# Patient Record
Sex: Female | Born: 1965 | Race: White | Hispanic: No | Marital: Married | Smoking: Never smoker
Health system: Southern US, Community
[De-identification: ages and names within clinical notes are randomized; demographics above are authoritative.]

## PROBLEM LIST (undated history)

## (undated) DIAGNOSIS — G4730 Sleep apnea, unspecified: Secondary | ICD-10-CM

## (undated) DIAGNOSIS — I10 Essential (primary) hypertension: Secondary | ICD-10-CM

## (undated) HISTORY — PX: NO PAST SURGERIES: SHX2092

---

## 2001-04-08 ENCOUNTER — Ambulatory Visit (HOSPITAL_COMMUNITY): Admission: RE | Admit: 2001-04-08 | Discharge: 2001-04-08 | Payer: Self-pay | Admitting: Family Medicine

## 2001-04-08 ENCOUNTER — Encounter: Payer: Self-pay | Admitting: Family Medicine

## 2003-12-30 ENCOUNTER — Other Ambulatory Visit: Admission: RE | Admit: 2003-12-30 | Discharge: 2003-12-30 | Payer: Self-pay | Admitting: Obstetrics and Gynecology

## 2004-03-03 ENCOUNTER — Ambulatory Visit (HOSPITAL_COMMUNITY): Admission: RE | Admit: 2004-03-03 | Discharge: 2004-03-03 | Payer: Self-pay | Admitting: Family Medicine

## 2005-05-01 ENCOUNTER — Other Ambulatory Visit: Admission: RE | Admit: 2005-05-01 | Discharge: 2005-05-01 | Payer: Self-pay | Admitting: Obstetrics and Gynecology

## 2013-07-14 ENCOUNTER — Other Ambulatory Visit: Payer: Self-pay | Admitting: Obstetrics and Gynecology

## 2013-07-14 DIAGNOSIS — R928 Other abnormal and inconclusive findings on diagnostic imaging of breast: Secondary | ICD-10-CM

## 2013-07-23 ENCOUNTER — Other Ambulatory Visit: Payer: Self-pay | Admitting: Obstetrics and Gynecology

## 2013-07-23 ENCOUNTER — Ambulatory Visit
Admission: RE | Admit: 2013-07-23 | Discharge: 2013-07-23 | Disposition: A | Discharge status: Home / Self Care | Payer: BC Managed Care – PPO | Source: Ambulatory Visit | Attending: Obstetrics and Gynecology | Admitting: Obstetrics and Gynecology

## 2013-07-23 ENCOUNTER — Encounter (INDEPENDENT_AMBULATORY_CARE_PROVIDER_SITE_OTHER): Payer: Self-pay

## 2013-07-23 DIAGNOSIS — R928 Other abnormal and inconclusive findings on diagnostic imaging of breast: Secondary | ICD-10-CM

## 2015-11-18 ENCOUNTER — Encounter: Payer: Self-pay | Admitting: Gastroenterology

## 2015-12-16 ENCOUNTER — Ambulatory Visit: Payer: Self-pay | Admitting: Gastroenterology

## 2017-08-08 ENCOUNTER — Other Ambulatory Visit: Payer: Self-pay | Admitting: Obstetrics and Gynecology

## 2017-08-08 DIAGNOSIS — R928 Other abnormal and inconclusive findings on diagnostic imaging of breast: Secondary | ICD-10-CM

## 2017-08-10 ENCOUNTER — Ambulatory Visit
Admission: RE | Admit: 2017-08-10 | Discharge: 2017-08-10 | Disposition: A | Discharge status: Home / Self Care | Payer: Self-pay | Source: Ambulatory Visit | Attending: Obstetrics and Gynecology | Admitting: Obstetrics and Gynecology

## 2017-08-10 ENCOUNTER — Ambulatory Visit
Admission: RE | Admit: 2017-08-10 | Discharge: 2017-08-10 | Disposition: A | Discharge status: Home / Self Care | Payer: BLUE CROSS/BLUE SHIELD | Source: Ambulatory Visit | Attending: Obstetrics and Gynecology | Admitting: Obstetrics and Gynecology

## 2017-08-10 ENCOUNTER — Other Ambulatory Visit: Payer: Self-pay | Admitting: Obstetrics and Gynecology

## 2017-08-10 DIAGNOSIS — N6320 Unspecified lump in the left breast, unspecified quadrant: Secondary | ICD-10-CM

## 2017-08-10 DIAGNOSIS — R928 Other abnormal and inconclusive findings on diagnostic imaging of breast: Secondary | ICD-10-CM

## 2017-11-19 ENCOUNTER — Ambulatory Visit (INDEPENDENT_AMBULATORY_CARE_PROVIDER_SITE_OTHER): Payer: Self-pay

## 2017-11-19 DIAGNOSIS — Z1211 Encounter for screening for malignant neoplasm of colon: Secondary | ICD-10-CM

## 2017-11-19 MED ORDER — PEG 3350-KCL-NA BICARB-NACL 420 G PO SOLR: 4000.0000 mL | ORAL | 0 refills | Status: DC

## 2017-11-19 MED ORDER — NA SULFATE-K SULFATE-MG SULF 17.5-3.13-1.6 GM/177ML PO SOLN: 1.0000 | ORAL | 0 refills | Status: DC

## 2017-11-19 NOTE — Addendum Note (Signed)
Addended by: Claudina Lick on: 11/19/2017 04:11 PM   Modules accepted: Orders

## 2017-11-19 NOTE — Progress Notes (Signed)
Gastroenterology Pre-Procedure Review  Request Date:11/19/17 Requesting Physician: Dr.Golding No previous tcs  PATIENT REVIEW QUESTIONS: The patient responded to the following health history questions as indicated:    1. Diabetes Melitis: no 2. Joint replacements in the past 12 months: no 3. Major health problems in the past 3 months: no 4. Has an artificial valve or MVP: no 5. Has a defibrillator: no 6. Has been advised in past to take antibiotics in advance of a procedure like teeth cleaning: no 7. Family history of colon cancer: no  8. Alcohol Use: yes (a beer daily) 9. History of sleep apnea: yes (cpap)  10. History of coronary artery or other vascular stents placed within the last 12 months: no 11. History of any prior anesthesia complications: no    MEDICATIONS & ALLERGIES:    Patient reports the following regarding taking any blood thinners:   Plavix? no Aspirin? no Coumadin? no Brilinta? no Xarelto? no Eliquis? no Pradaxa? no Savaysa? no Effient? no  Patient confirms/reports the following medications:  Current Outpatient Medications  Medication Sig Dispense Refill  . escitalopram (LEXAPRO) 20 MG tablet daily.    Marland Kitchen omeprazole (PRILOSEC) 20 MG capsule Take 20 mg by mouth daily.    Marland Kitchen telmisartan (MICARDIS) 80 MG tablet Take 80 mg by mouth daily.  1   No current facility-administered medications for this visit.     Patient confirms/reports the following allergies:  No Known Allergies  No orders of the defined types were placed in this encounter.   AUTHORIZATION INFORMATION Primary Insurance: [place],  [place] #: [id number] Pre-Cert / Auth required: no   SCHEDULE INFORMATION: Procedure has been scheduled as follows:  Date: 01/18/18, Time: 8:30 Location: [facility]  This Gastroenterology Pre-Precedure Review Form is being routed to the following provider(s): Neil Crouch, PA

## 2017-11-19 NOTE — Progress Notes (Signed)
Ok to schedule.

## 2017-11-19 NOTE — Progress Notes (Signed)
Pt called- suprep is $100.00, I have sent in trilyte and mailed new instructions to the pt.

## 2017-11-19 NOTE — Patient Instructions (Addendum)
Molly Ballard   Nov 26, 1965 MRN: [id number]    Procedure Date: 01/18/18 Time to register: 7:30am Place to register: [facility] Procedure Time: 8:30am Scheduled provider: Barney Drain, MD  PREPARATION FOR COLONOSCOPY WITH TRI-LYTE SPLIT PREP  Please notify us immediately if you are diabetic, take iron supplements, or if you are on Coumadin or any other blood thinners.   You will need to purchase 1 fleet enema and 1 box of Bisacodyl 69m tablets. These are available over the counter at your pharmacy.    1 DAY BEFORE PROCEDURE:  DATE: 01/17/18   DAY: Thursday  clear liquids the entire day - NO SOLID FOOD.   At 2:00 pm:  Take 2 Bisacodyl tablets.   At 4:00pm:  Start drinking your solution. Make sure you mix well per instructions on the bottle. Try to drink 1 (one) 8 ounce glass every 10-15 minutes until you have consumed HALF the jug. You should complete by 6:00pm.You must keep the left over solution refrigerated until completed next day.  Continue clear liquids. You must drink plenty of clear liquids to prevent dehyration and kidney failure.     DAY OF PROCEDURE:   DATE: 01/18/18   DAY: Friday If you take medications for your heart, blood pressure or breathing, you may take these medications.   Five hours before your procedure time @ 3:30am:  Finish remaining amout of bowel prep, drinking 1 (one) 8 ounce glass every 10-15 minutes until complete. You have two hours to consume remaining prep.   Three hours before your procedure time _0 :30am:  Nothing by mouth.   At least one hour before going to the hospital:  Give yourself one Fleet enema.  You may take your morning medications with sip of water unless we have instructed otherwise.      Please see below for Dietary Information.  CLEAR LIQUIDS INCLUDE:  Water Jello (NOT red in color)   Ice Popsicles (NOT red in color)   Tea (sugar ok, no milk/cream) Powdered fruit flavored drinks  Coffee (sugar ok, no  milk/cream) Gatorade/ Lemonade/ Kool-Aid  (NOT red in color)   Juice: apple, white grape, white cranberry Soft drinks  Clear bullion, consomme, broth (fat free beef/chicken/vegetable)  Carbonated beverages (any kind)  Strained chicken noodle soup Hard Candy   Remember: Clear liquids are liquids that will allow you to see your fingers on the other side of a clear glass. Be sure liquids are NOT red in color, and not cloudy, but CLEAR.  DO NOT EAT OR DRINK ANY OF THE FOLLOWING:  Dairy products of any kind   Cranberry juice Tomato juice / V8 juice   Grapefruit juice Orange juice     Red grape juice  Do not eat any solid foods, including such foods as: cereal, oatmeal, yogurt, fruits, vegetables, creamed soups, eggs, bread, crackers, pureed foods in a blender, etc.   HELPFUL HINTS FOR DRINKING PREP SOLUTION:   Make sure prep is extremely cold. Mix and refrigerate the the morning of the prep. You may also put in the freezer.   You may try mixing some Crystal Light or Country Time Lemonade if you prefer. Mix in small amounts; add more if necessary.  Try drinking through a straw  Rinse mouth with water or a mouthwash between glasses, to remove after-taste.  Try sipping on a cold beverage /ice/ popsicles between glasses of prep.  Place a piece of sugar-free hard candy in mouth between glasses.  If you become nauseated, try  consuming smaller amounts, or stretch out the time between glasses. Stop for 30-60 minutes, then slowly start back drinking.        OTHER INSTRUCTIONS  You will need a responsible adult at least 51 years of age to accompany you and drive you home. This person must remain in the waiting room during your procedure. The hospital will cancel your procedure if you do not have a responsible adult with you.   1. Wear loose fitting clothing that is easily removed. 2. Leave jewelry and other valuables at home.  3. Remove all body piercing jewelry and leave at  home. 4. Total time from sign-in until discharge is approximately 2-3 hours. 5. You should go home directly after your procedure and rest. You can resume normal activities the day after your procedure. 6. The day of your procedure you should not:  Drive  Make legal decisions  Operate machinery  Drink alcohol  Return to work   You may call the office (Dept: [phone]) before 5:00pm, or page the doctor on call [phone]) after 5:00pm, for further instructions, if necessary.   Insurance Information YOU WILL NEED TO CHECK WITH YOUR INSURANCE COMPANY FOR THE BENEFITS OF COVERAGE YOU HAVE FOR THIS PROCEDURE.  UNFORTUNATELY, NOT ALL INSURANCE COMPANIES HAVE BENEFITS TO COVER ALL OR PART OF THESE TYPES OF PROCEDURES.  IT IS YOUR RESPONSIBILITY TO CHECK YOUR BENEFITS, HOWEVER, WE WILL BE GLAD TO ASSIST YOU WITH ANY CODES YOUR INSURANCE COMPANY MAY NEED.    PLEASE NOTE THAT MOST INSURANCE COMPANIES WILL NOT COVER A SCREENING COLONOSCOPY FOR PEOPLE UNDER THE AGE OF 50  IF YOU HAVE BC[employer] INSURANCE, YOU MAY HAVE BENEFITS FOR A SCREENING COLONOSCOPY BUT IF POLYPS ARE FOUND THE DIAGNOSIS WILL CHANGE AND THEN YOU MAY HAVE A DEDUCTIBLE THAT WILL NEED TO BE MET. SO PLEASE MAKE SURE YOU CHECK YOUR BENEFITS FOR A SCREENING COLONOSCOPY AS WELL AS A DIAGNOSTIC COLONOSCOPY.

## 2018-01-18 ENCOUNTER — Encounter (HOSPITAL_COMMUNITY)
Admission: RE | Disposition: A | Discharge status: Home / Self Care | Payer: Self-pay | Source: Ambulatory Visit | Attending: Gastroenterology

## 2018-01-18 ENCOUNTER — Ambulatory Visit (HOSPITAL_COMMUNITY)
Admission: RE | Admit: 2018-01-18 | Discharge: 2018-01-18 | Disposition: A | Discharge status: Home / Self Care | Payer: BLUE CROSS/BLUE SHIELD | Source: Ambulatory Visit | Attending: Gastroenterology | Admitting: Gastroenterology

## 2018-01-18 ENCOUNTER — Encounter (HOSPITAL_COMMUNITY): Payer: Self-pay | Admitting: *Deleted

## 2018-01-18 ENCOUNTER — Other Ambulatory Visit: Payer: Self-pay

## 2018-01-18 DIAGNOSIS — K648 Other hemorrhoids: Secondary | ICD-10-CM | POA: Diagnosis not present

## 2018-01-18 DIAGNOSIS — D125 Benign neoplasm of sigmoid colon: Secondary | ICD-10-CM

## 2018-01-18 DIAGNOSIS — Z1211 Encounter for screening for malignant neoplasm of colon: Secondary | ICD-10-CM

## 2018-01-18 DIAGNOSIS — Z791 Long term (current) use of non-steroidal anti-inflammatories (NSAID): Secondary | ICD-10-CM | POA: Insufficient documentation

## 2018-01-18 DIAGNOSIS — K635 Polyp of colon: Secondary | ICD-10-CM | POA: Diagnosis not present

## 2018-01-18 DIAGNOSIS — D124 Benign neoplasm of descending colon: Secondary | ICD-10-CM | POA: Diagnosis not present

## 2018-01-18 DIAGNOSIS — G4730 Sleep apnea, unspecified: Secondary | ICD-10-CM | POA: Diagnosis not present

## 2018-01-18 DIAGNOSIS — Z79899 Other long term (current) drug therapy: Secondary | ICD-10-CM | POA: Insufficient documentation

## 2018-01-18 DIAGNOSIS — Q438 Other specified congenital malformations of intestine: Secondary | ICD-10-CM | POA: Diagnosis not present

## 2018-01-18 DIAGNOSIS — I10 Essential (primary) hypertension: Secondary | ICD-10-CM | POA: Diagnosis not present

## 2018-01-18 HISTORY — PX: POLYPECTOMY: SHX5525

## 2018-01-18 HISTORY — DX: Essential (primary) hypertension: I10

## 2018-01-18 HISTORY — PX: COLONOSCOPY: SHX5424

## 2018-01-18 HISTORY — DX: Sleep apnea, unspecified: G47.30

## 2018-01-18 SURGERY — COLONOSCOPY
Anesthesia: Moderate Sedation

## 2018-01-18 MED ORDER — MIDAZOLAM HCL 5 MG/5ML IJ SOLN
INTRAMUSCULAR | Status: DC | PRN
Start: 2018-01-18 — End: 2018-01-18
  Administered 2018-01-18 (×4): 2 mg via INTRAVENOUS

## 2018-01-18 MED ORDER — SIMETHICONE 40 MG/0.6ML PO SUSP
ORAL | Status: DC | PRN
  Administered 2018-01-18: 1.5 mL

## 2018-01-18 MED ORDER — MEPERIDINE HCL 100 MG/ML IJ SOLN
INTRAMUSCULAR | Status: DC | PRN
  Administered 2018-01-18: 50 mg
  Administered 2018-01-18 (×2): 25 mg

## 2018-01-18 MED ORDER — SODIUM CHLORIDE 0.9 % IV SOLN
INTRAVENOUS | Status: DC
  Administered 2018-01-18: 08:00:00 via INTRAVENOUS

## 2018-01-18 MED ORDER — MEPERIDINE HCL 100 MG/ML IJ SOLN
INTRAMUSCULAR | Status: AC
  Filled 2018-01-18: qty 2

## 2018-01-18 MED ORDER — MIDAZOLAM HCL 5 MG/5ML IJ SOLN
INTRAMUSCULAR | Status: AC
  Filled 2018-01-18: qty 10

## 2018-01-18 NOTE — H&P (Signed)
Primary Care Physician:  Sharilyn Sites, MD Primary Gastroenterologist:  Dr. Oneida Alar  Pre-Procedure History & Physical: HPI:  Molly Ballard is a 52 y.o. female here for [place].  Past Medical History:  Diagnosis Date  . Hypertension   . Sleep apnea     Past Surgical History:  Procedure Laterality Date  . NO PAST SURGERIES      Prior to Admission medications   Medication Sig Start Date End Date Taking? Authorizing Provider  escitalopram (LEXAPRO) 20 MG tablet Take 20 mg by mouth daily.  07/02/17  Yes [provider]  ibuprofen (ADVIL,MOTRIN) 200 MG tablet Take 400 mg by mouth every 8 (eight) hours as needed (pain/headaches.).   Yes [provider]  omeprazole (PRILOSEC) 20 MG capsule Take 20 mg by mouth daily.   Yes [provider]  polyethylene glycol-electrolytes (TRILYTE) 420 g solution Take 4,000 mLs by mouth as directed. 11/19/17  Yes Mahala Menghini, PA-C  progesterone (PROMETRIUM) 100 MG capsule Take 100 mg by mouth at bedtime. 12/30/17  Yes [provider]  telmisartan (MICARDIS) 80 MG tablet Take 80 mg by mouth daily. 10/20/17  Yes [provider]    Allergies as of 11/19/2017  . (No Known Allergies)    Family History  Problem Relation Age of Onset  . Breast cancer Sister        diagnosed in her 66's  . Colon cancer Neg Hx   . Colon polyps Neg Hx     Social History   Socioeconomic History  . Marital status: Married    Spouse name: Not on file  . Number of children: Not on file  . Years of education: Not on file  . Highest education level: Not on file  Occupational History  . Not on file  Social Needs  . Financial resource strain: Not on file  . Food insecurity:    Worry: Not on file    Inability: Not on file  . Transportation needs:    Medical: Not on file    Non-medical: Not on file  Tobacco Use  . Smoking status: Never Smoker  . Smokeless tobacco: Never Used  Substance and Sexual  Activity  . Alcohol use: Yes    Alcohol/week: 2.0 standard drinks    Types: 2 Cans of beer per week    Comment: 2 cans beer daily to every other day   . Drug use: Never  . Sexual activity: Not on file  Lifestyle  . Physical activity:    Days per week: Not on file    Minutes per session: Not on file  . Stress: Not on file  Relationships  . Social connections:    Talks on phone: Not on file    Gets together: Not on file    Attends religious service: Not on file    Active member of club or organization: Not on file    Attends meetings of clubs or organizations: Not on file    Relationship status: Not on file  . Intimate partner violence:    Fear of current or ex partner: Not on file    Emotionally abused: Not on file    Physically abused: Not on file    Forced sexual activity: Not on file  Other Topics Concern  . Not on file  Social History Narrative  . Not on file    Review of Systems: See HPI, otherwise negative ROS   Physical Exam: BP 135/89   Pulse 80  Temp 98.9 F (37.2 C) (Oral)   Resp 12   Ht 5\' 2"  (1.575 m)   Wt 79.4 kg   SpO2 96%   BMI 32.01 kg/m  General:   Alert,  pleasant and cooperative in NAD Head:  Normocephalic and atraumatic. Neck:  Supple; Lungs:  Clear throughout to auscultation.    Heart:  Regular rate and rhythm. Abdomen:  Soft, nontender and nondistended. Normal bowel sounds, without guarding, and without rebound.   Neurologic:  Alert and  oriented x4;  grossly normal neurologically.  Impression/Plan:    SCREENING  Plan:  1. TCS TODAY DISCUSSED PROCEDURE, BENEFITS, & RISKS: < 1% chance of medication reaction, bleeding, perforation, or rupture of spleen/liver.

## 2018-01-18 NOTE — Op Note (Signed)
[facility] Patient Name: Molly Ballard Procedure Date: 01/18/2018 8:13 AM MRN: [id number] Date of Birth: 30-Oct-1965 Attending MD: Barney Drain MD, MD CSN: [id number] Age: 52 Admit Type: Outpatient Procedure:                Colonoscopy WITH COLD SNARE POLYPECTOMY Indications:              Screening for colorectal malignant neoplasm Providers:                Barney Drain MD, MD, Lurline Del, RN, Randa Spike, Technician Referring MD:             Halford Chessman MD, MD Medicines:                Meperidine 100 mg IV, Midazolam 8 mg IV Complications:            No immediate complications. Estimated Blood Loss:     Estimated blood loss was minimal. Procedure:                Pre-Anesthesia Assessment:                           - Prior to the procedure, a History and Physical                            was performed, and patient medications and                            allergies were reviewed. The patient's tolerance of                            previous anesthesia was also reviewed. The risks                            and benefits of the procedure and the sedation                            options and risks were discussed with the patient.                            All questions were answered, and informed consent                            was obtained. Prior Anticoagulants: The patient has                            taken no previous anticoagulant or antiplatelet                            agents. ASA Grade Assessment: II - A patient with                            mild systemic disease. After reviewing the risks  and benefits, the patient was deemed in                            satisfactory condition to undergo the procedure.                            After obtaining informed consent, the colonoscope                            was passed under direct vision. Throughout the                            procedure, the  patient's blood pressure, pulse, and                            oxygen saturations were monitored continuously. The                            CF-[id number] ([id number]) scope was introduced through                            the anus and advanced to the the cecum, identified                            by appendiceal orifice and ileocecal valve. The                            colonoscopy was somewhat difficult due to a                            tortuous colon. Successful completion of the                            procedure was aided by straightening and shortening                            the scope to obtain bowel loop reduction and                            COLOWRAP. The patient tolerated the procedure well.                            The quality of the bowel preparation was excellent.                            The ileocecal valve, appendiceal orifice, and                            rectum were photographed. Scope In: 9:02:21 AM Scope Out: 9:19:21 AM Scope Withdrawal Time: 0 hours 14 minutes 53 seconds  Total Procedure Duration: 0 hours 17 minutes 0 seconds  Findings:      Three sessile polyps were found in the sigmoid colon and descending       colon(2). The polyps  were 2 to 4 mm in size. These polyps were removed       with a cold snare. Resection and retrieval were complete.      The recto-sigmoid colon and sigmoid colon were mildly tortuous.      Internal hemorrhoids were found. The hemorrhoids were small. Impression:               - Three 2 to 4 mm polyps in the sigmoid colon and                            in the descending colon, removed with a cold snare.                            Resected and retrieved.                           - Tortuous LEFT colon.                           - Internal hemorrhoids. Moderate Sedation:      Moderate (conscious) sedation was administered by the endoscopy nurse       and supervised by the endoscopist. The following parameters were        monitored: oxygen saturation, heart rate, blood pressure, and response       to care. Total physician intraservice time was 34 minutes. Recommendation:           - Patient has a contact number available for                            emergencies. The signs and symptoms of potential                            delayed complications were discussed with the                            patient. Return to normal activities tomorrow.                            Written discharge instructions were provided to the                            patient.                           - High fiber diet.                           - Continue present medications.                           - Await pathology results.                           - Repeat colonoscopy in 3 years for surveillance. Procedure Code(s):        --- Professional ---  [phone], Colonoscopy, flexible; with removal of                            tumor(s), polyp(s), or other lesion(s) by snare                            technique                           99153, Moderate sedation; each additional 15                            minutes intraservice time                           G0500, Moderate sedation services provided by the                            same physician or other qualified health care                            professional performing a gastrointestinal                            endoscopic service that sedation supports,                            requiring the presence of an independent trained                            observer to assist in the monitoring of the                            patient's level of consciousness and physiological                            status; initial 15 minutes of intra-service time;                            patient age 42 years or older (additional time may                            be reported with [phone], as appropriate) Diagnosis Code(s):        --- Professional ---                            Z12.11, Encounter for screening for malignant                            neoplasm of colon                           D12.5, Benign neoplasm of sigmoid colon                           D12.4, Benign  neoplasm of descending colon                           K64.8, Other hemorrhoids                           Q43.8, Other specified congenital malformations of                            intestine CPT copyright 2018 [employer]. All rights reserved. The codes documented in this report are preliminary and upon coder review may  be revised to meet current compliance requirements. Barney Drain, MD Barney Drain MD, MD 01/18/2018 9:29:27 AM This report has been signed electronically. Number of Addenda: 0

## 2018-01-18 NOTE — Discharge Instructions (Signed)
You had 3 small polyps removed. You have SMALL internal hemorrhoids.   DRINK WATER TO KEEP YOUR URINE LIGHT YELLOW.  FOLLOW A HIGH FIBER DIET. AVOID ITEMS THAT CAUSE BLOATING & GAS. SEE INFO BELOW.  YOUR BIOPSY RESULTS WILL BE BACK IN 5 BUSINESS DAYS.  Next colonoscopy in 3-10 years.    Colonoscopy Care After Read the instructions outlined below and refer to this sheet in the next week. These discharge instructions provide you with general information on caring for yourself after you leave the hospital. While your treatment has been planned according to the most current medical practices available, unavoidable complications occasionally occur. If you have any problems or questions after discharge, call DR. FIELDS, [phone].  ACTIVITY  You may resume your regular activity, but move at a slower pace for the next 24 hours.   Take frequent rest periods for the next 24 hours.   Walking will help get rid of the air and reduce the bloated feeling in your belly (abdomen).   No driving for 24 hours (because of the medicine (anesthesia) used during the test).   You may shower.   Do not sign any important legal documents or operate any machinery for 24 hours (because of the anesthesia used during the test).    NUTRITION  Drink plenty of fluids.   You may resume your normal diet as instructed by your doctor.   Begin with a light meal and progress to your normal diet. Heavy or fried foods are harder to digest and may make you feel sick to your stomach (nauseated).   Avoid alcoholic beverages for 24 hours or as instructed.    MEDICATIONS  You may resume your normal medications.   WHAT YOU CAN EXPECT TODAY  Some feelings of bloating in the abdomen.   Passage of more gas than usual.   Spotting of blood in your stool or on the toilet paper  .  IF YOU HAD POLYPS REMOVED DURING THE COLONOSCOPY:  Eat a soft diet IF YOU HAVE NAUSEA, BLOATING, ABDOMINAL PAIN, OR VOMITING.      FINDING OUT THE RESULTS OF YOUR TEST Not all test results are available during your visit. DR. Oneida Alar WILL CALL YOU WITHIN 14 DAYS OF YOUR PROCEDUE WITH YOUR RESULTS. Do not assume everything is normal if you have not heard from DR. FIELDS, CALL HER OFFICE AT [phone].  SEEK IMMEDIATE MEDICAL ATTENTION AND CALL THE OFFICE: [phone] IF:  You have more than a spotting of blood in your stool.   Your belly is swollen (abdominal distention).   You are nauseated or vomiting.   You have a temperature over 101F.   You have abdominal pain or discomfort that is severe or gets worse throughout the day.   High-Fiber Diet A high-fiber diet changes your normal diet to include more whole grains, legumes, fruits, and vegetables. Changes in the diet involve replacing refined carbohydrates with unrefined foods. The calorie level of the diet is essentially unchanged. The Dietary Reference Intake (recommended amount) for adult males is 38 grams per day. For adult females, it is 25 grams per day. Pregnant and lactating women should consume 28 grams of fiber per day. Fiber is the intact part of a plant that is not broken down during digestion. Functional fiber is fiber that has been isolated from the plant to provide a beneficial effect in the body. PURPOSE  Increase stool bulk.   Ease and regulate bowel movements.   Lower cholesterol.   REDUCE RISK OF  COLON CANCER  INDICATIONS THAT YOU NEED MORE FIBER  Constipation and hemorrhoids.   Uncomplicated diverticulosis (intestine condition) and irritable bowel syndrome.   Weight management.   As a protective measure against hardening of the arteries (atherosclerosis), diabetes, and cancer.   GUIDELINES FOR INCREASING FIBER IN THE DIET  Start adding fiber to the diet slowly. A gradual increase of about 5 more grams (2 slices of whole-wheat bread, 2 servings of most fruits or vegetables, or 1 bowl of high-fiber cereal) per day is best. Too rapid  an increase in fiber may result in constipation, flatulence, and bloating.   Drink enough water and fluids to keep your urine clear or pale yellow. Water, juice, or caffeine-free drinks are recommended. Not drinking enough fluid may cause constipation.   Eat a variety of high-fiber foods rather than one type of fiber.   Try to increase your intake of fiber through using high-fiber foods rather than fiber pills or supplements that contain small amounts of fiber.   The goal is to change the types of food eaten. Do not supplement your present diet with high-fiber foods, but replace foods in your present diet.   INCLUDE A VARIETY OF FIBER SOURCES  Replace refined and processed grains with whole grains, canned fruits with fresh fruits, and incorporate other fiber sources. White rice, white breads, and most bakery goods contain little or no fiber.   Brown whole-grain rice, buckwheat oats, and many fruits and vegetables are all good sources of fiber. These include: broccoli, Brussels sprouts, cabbage, cauliflower, beets, sweet potatoes, white potatoes (skin on), carrots, tomatoes, eggplant, squash, berries, fresh fruits, and dried fruits.   Cereals appear to be the richest source of fiber. Cereal fiber is found in whole grains and bran. Bran is the fiber-rich outer coat of cereal grain, which is largely removed in refining. In whole-grain cereals, the bran remains. In breakfast cereals, the largest amount of fiber is found in those with "bran" in their names. The fiber content is sometimes indicated on the label.   You may need to include additional fruits and vegetables each day.   In baking, for 1 cup white flour, you may use the following substitutions:   1 cup whole-wheat flour minus 2 tablespoons.   1/2 cup white flour plus 1/2 cup whole-wheat flour.   Polyps, Colon  A polyp is extra tissue that grows inside your body. Colon polyps grow in the large intestine. The large intestine, also  called the colon, is part of your digestive system. It is a long, hollow tube at the end of your digestive tract where your body makes and stores stool. Most polyps are not dangerous. They are benign. This means they are not cancerous. But over time, some types of polyps can turn into cancer. Polyps that are smaller than a pea are usually not harmful. But larger polyps could someday become or may already be cancerous. To be safe, doctors remove all polyps and test them.   WHO GETS POLYPS? Anyone can get polyps, but certain people are more likely than others. You may have a greater chance of getting polyps if:  You are over 50.   You have had polyps before.   Someone in your family has had polyps.   Someone in your family has had cancer of the large intestine.   Find out if someone in your family has had polyps. You may also be more likely to get polyps if you:   Eat a lot of fatty  foods   Smoke   Drink alcohol   Do not exercise  Eat too much   PREVENTION There is not one sure way to prevent polyps. You might be able to lower your risk of getting them if you:  Eat more fruits and vegetables and less fatty food.   Do not smoke.   Avoid alcohol.   Exercise every day.   Lose weight if you are overweight.   Eating more calcium and folate can also lower your risk of getting polyps. Some foods that are rich in calcium are milk, cheese, and broccoli. Some foods that are rich in folate are chickpeas, kidney beans, and spinach.

## 2018-01-23 ENCOUNTER — Encounter (HOSPITAL_COMMUNITY): Payer: Self-pay | Admitting: Gastroenterology

## 2018-02-12 ENCOUNTER — Ambulatory Visit
Admission: RE | Admit: 2018-02-12 | Discharge: 2018-02-12 | Disposition: A | Discharge status: Home / Self Care | Payer: BLUE CROSS/BLUE SHIELD | Source: Ambulatory Visit | Attending: Obstetrics and Gynecology | Admitting: Obstetrics and Gynecology

## 2018-02-12 DIAGNOSIS — N6320 Unspecified lump in the left breast, unspecified quadrant: Secondary | ICD-10-CM

## 2019-12-17 IMAGING — MG DIGITAL DIAGNOSTIC UNILATERAL LEFT MAMMOGRAM WITH TOMO AND CAD
4 series · 4 of 12 positions shown · non-contrast
Comparison: 08/06/2017 and earlier

CLINICAL DATA: Patient returns after screening study for evaluation
of possible LEFT breast mass.

EXAM:
DIGITAL DIAGNOSTIC LEFT MAMMOGRAM WITH CAD AND TOMO
ULTRASOUND LEFT BREAST

[L MLO synth-2D]
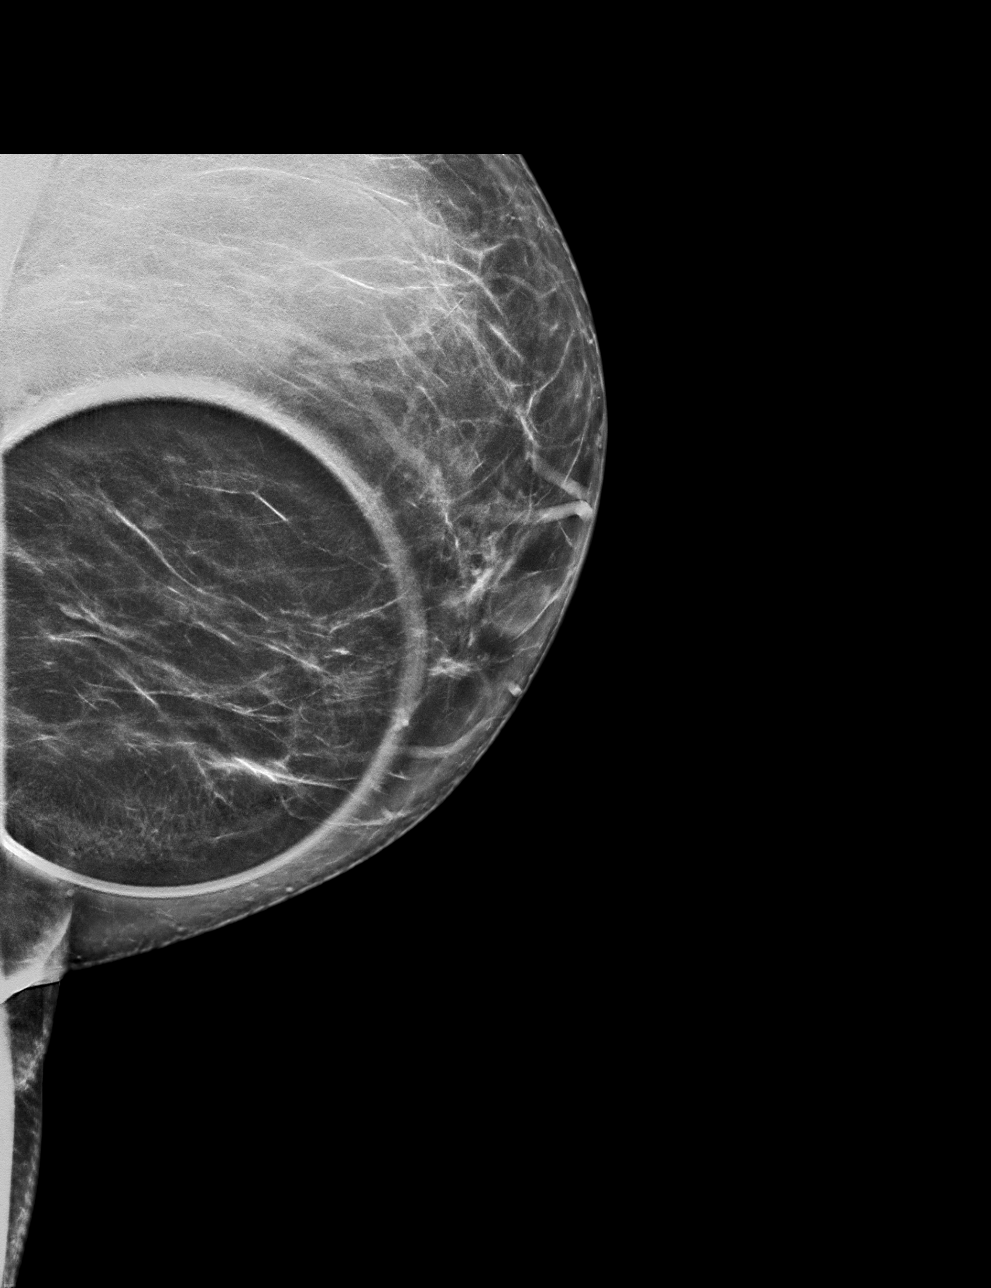

[L CC synth-2D]
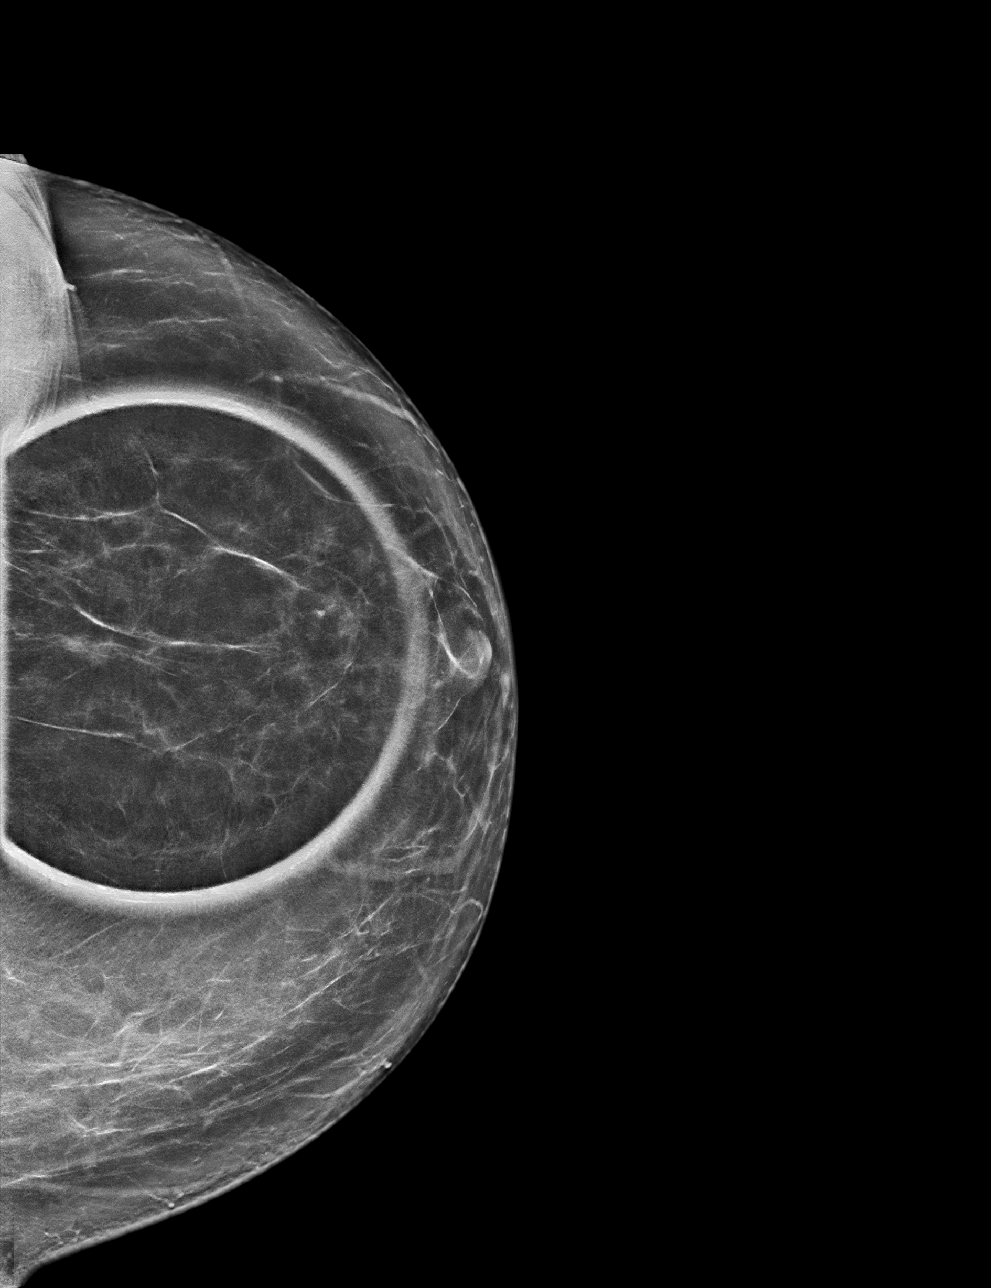

[L MLO tomo · tomo slice 38/75.0]
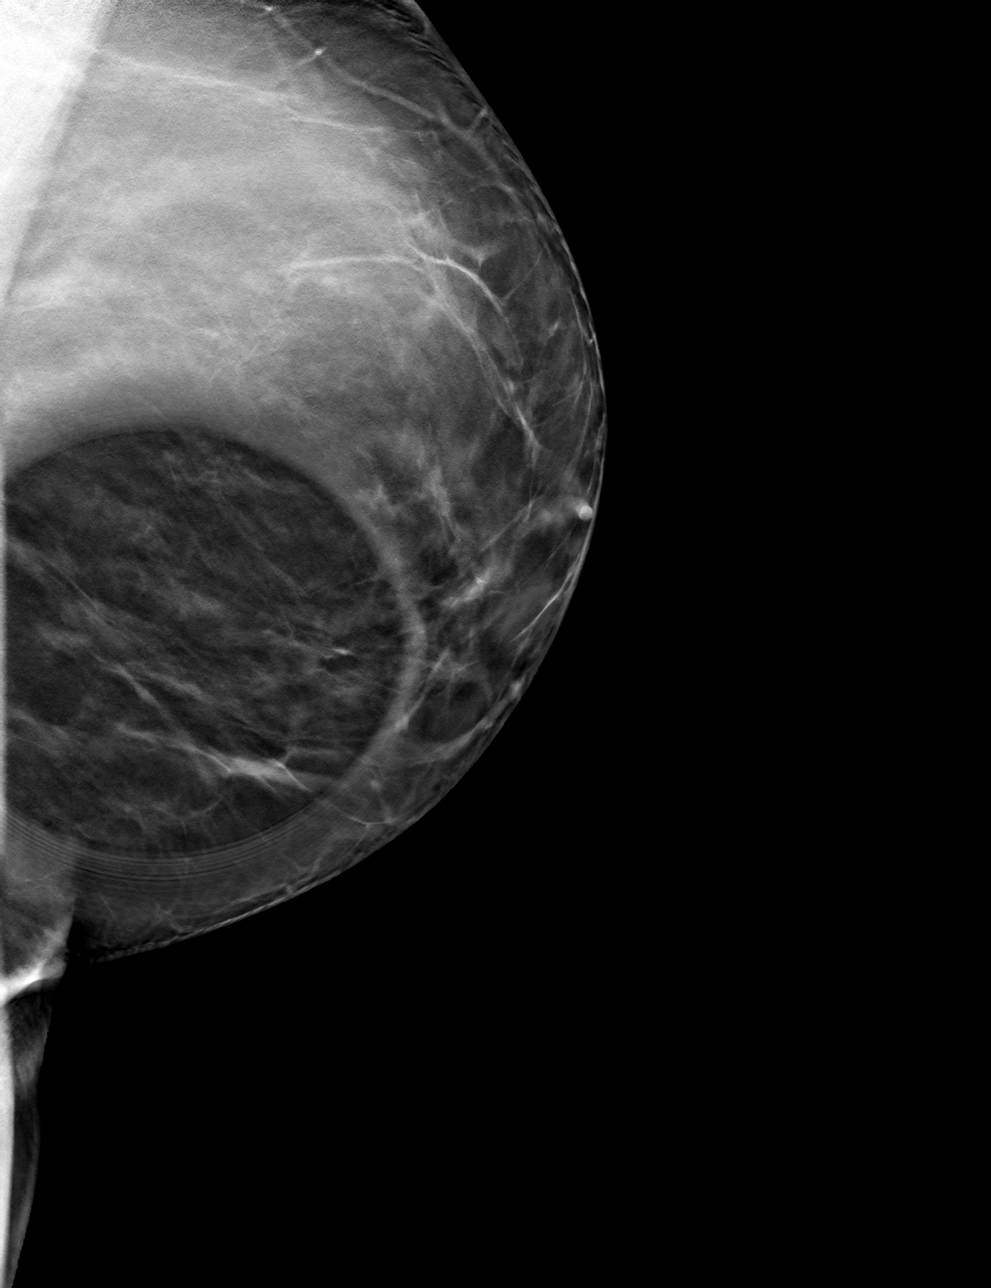

[L CC tomo · tomo slice 38/75.0]
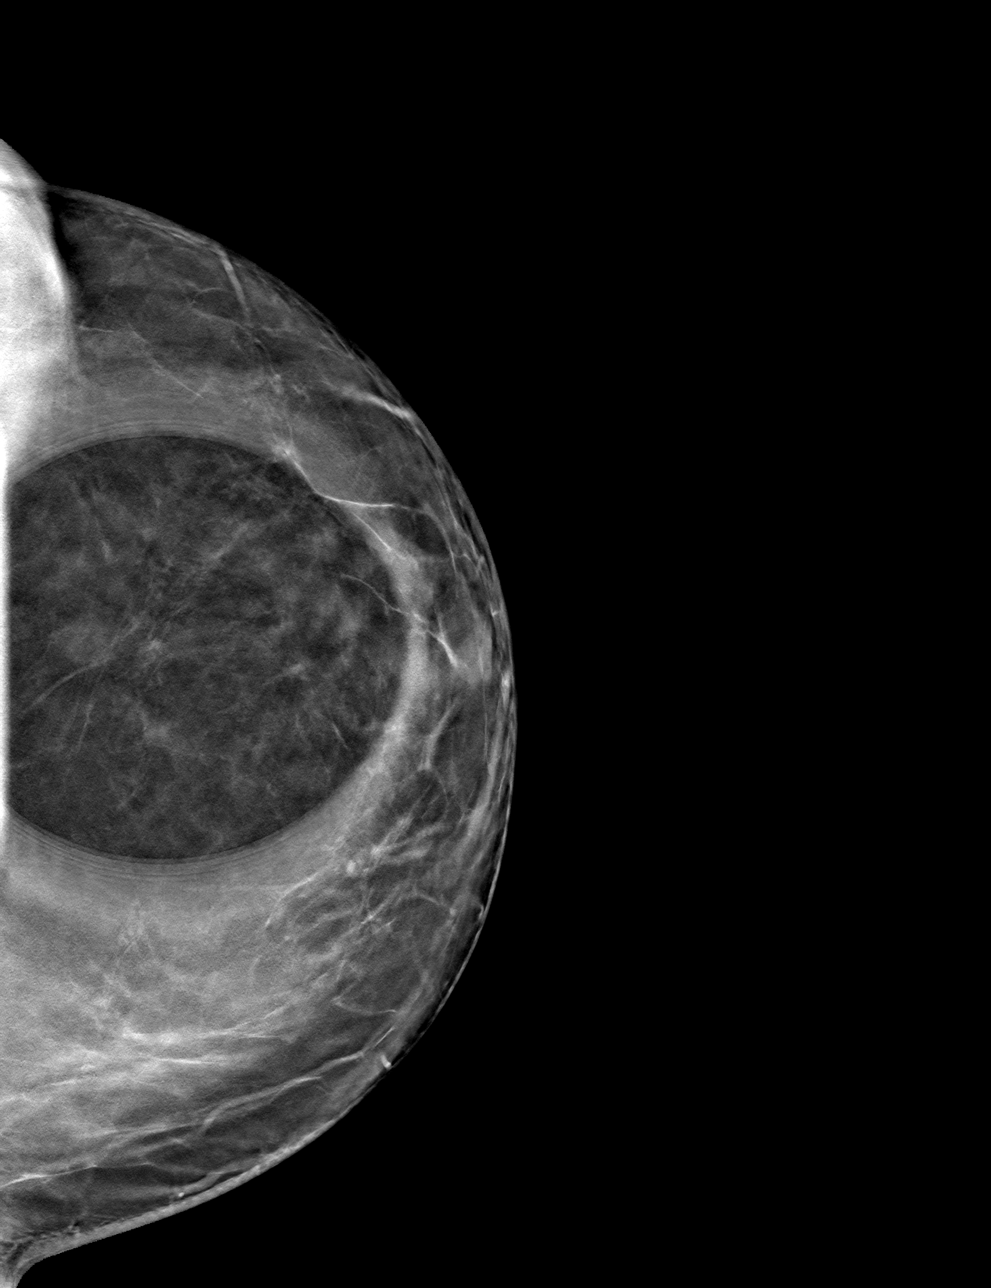

[4 of 12 positions shown; findings below may reference images not displayed]

ACR Breast Density Category b: There are scattered areas of
fibroglandular density.
FINDINGS: Additional 2-D and 3-D images are performed. There is persistent
oval mass in the LOWER central portion of the LEFT breast further
evaluated with ultrasound.

Mammographic images were processed with CAD.

On physical exam, I palpate no abnormality in the LOWER central
portion of the LEFT breast.

Targeted ultrasound is performed, showing an oval anechoic mass with
internal septation in the 5:30 o'clock location of the LEFT breast 3
centimeters from the nipple. There is associated posterior acoustic
enhancement. Mass measures 0.5 x 0.7 x 0.3 centimeters. There is no
internal blood flow. No other mass identified in this portion of the
breast.
IMPRESSION: Probable fibrocystic change in the 5:30 o'clock location LEFT breast
accounting for the mammographic abnormality. No mammographic or
ultrasound evidence for malignancy.

RECOMMENDATION:
LEFT breast ultrasound is recommended in 6 months to assess
stability.

I have discussed the findings and recommendations with the patient.
Results were also provided in writing at the conclusion of the
visit. If applicable, a reminder letter will be sent to the patient
regarding the next appointment.

BI-RADS CATEGORY  3: Probably benign.

## 2020-06-20 IMAGING — US ULTRASOUND LEFT BREAST LIMITED
1 series · 4 of 4 positions shown · non-contrast
Comparison: 08/10/2017 ultrasound

CLINICAL DATA: 52-year-old female for six-month follow-up of LEFT
breast mass.

EXAM:
ULTRASOUND OF THE LEFT BREAST

[Series 1: ultrasound left breast limited · 0.05mm/px · 4 of 4 slices shown]
[im 1/4]
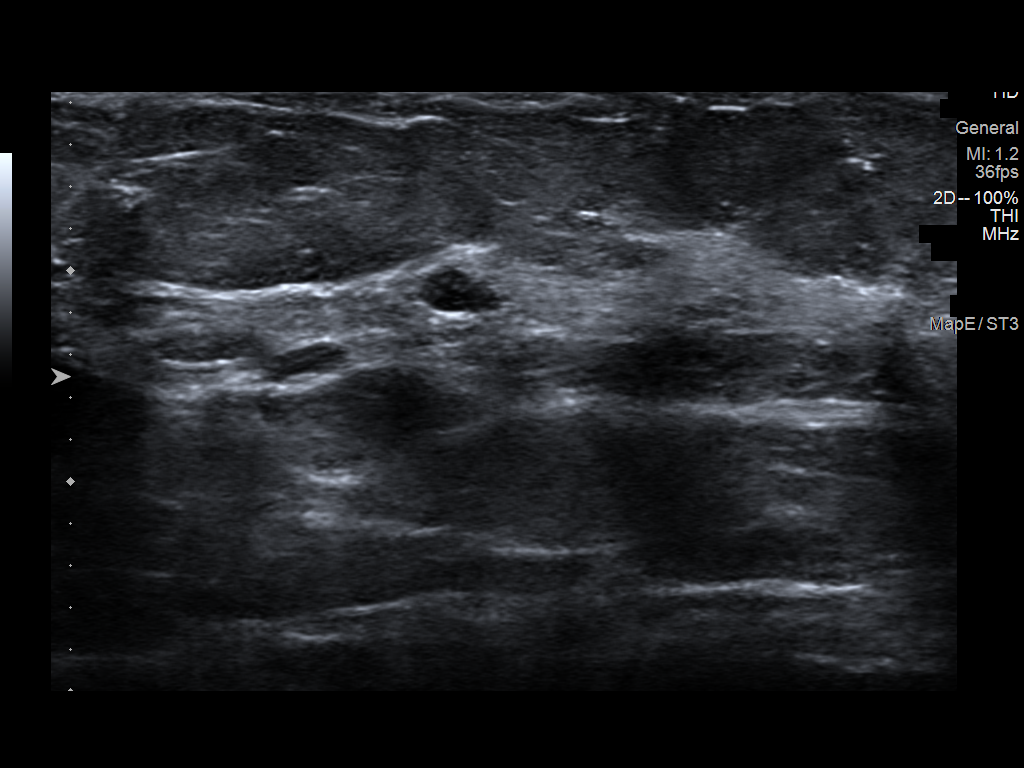
[im 2/4]
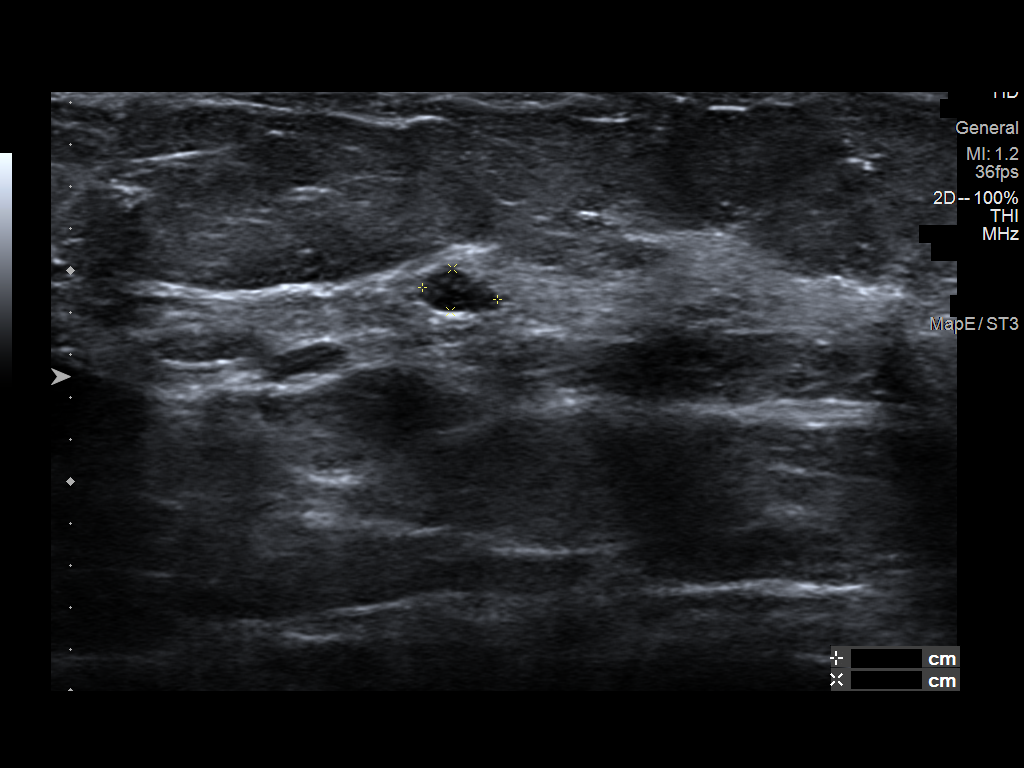
[im 3/4]
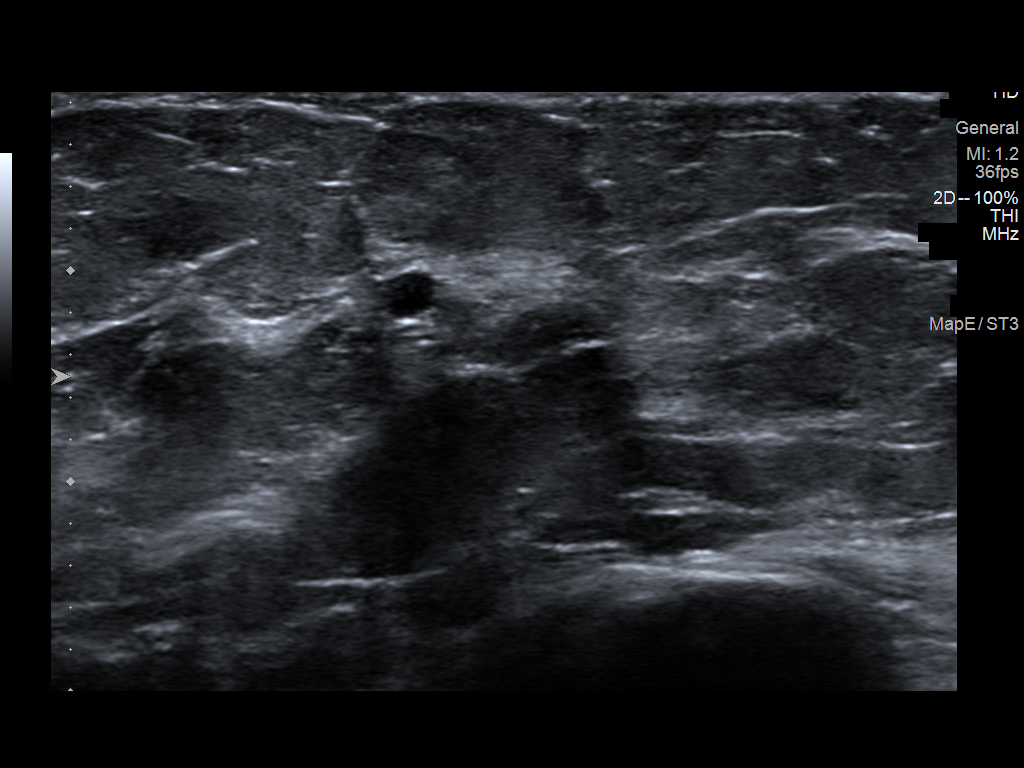
[im 4/4]
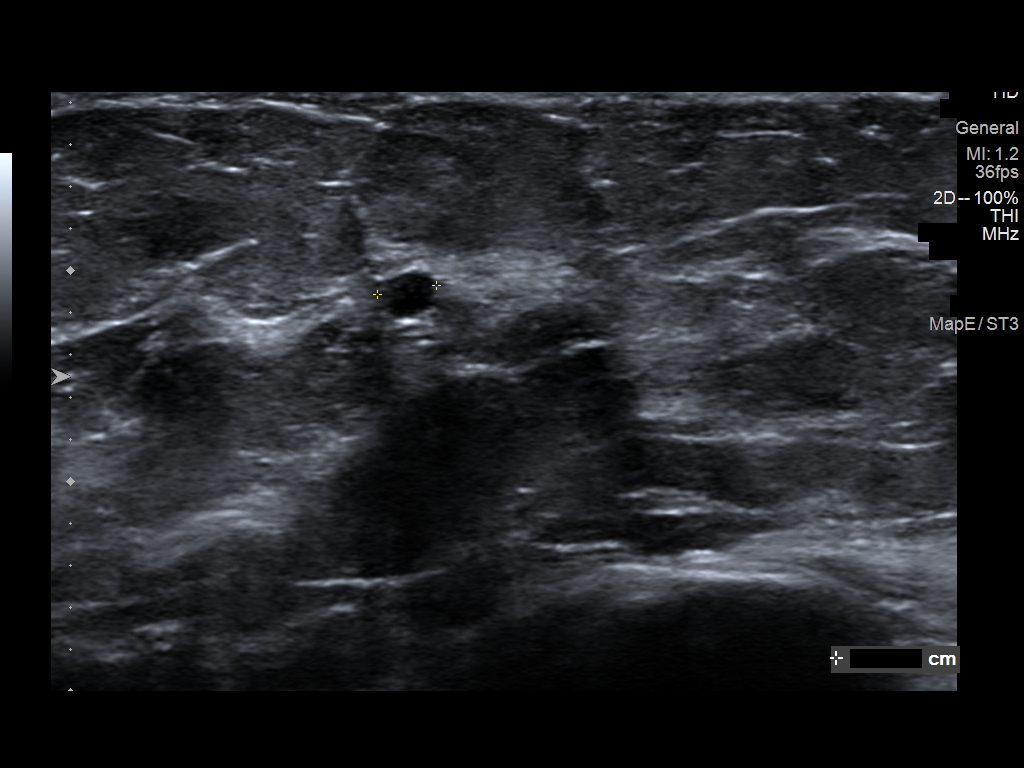

[4 of 4 positions shown; findings below may reference images not displayed]

FINDINGS: Targeted ultrasound is performed, showing a 4 x 2 x 3 mm
circumscribed very hypoechoic oval parallel mass at the [DATE]
position of the LEFT breast 3 cm from the nipple, previously
measuring 7 x 3 x 5 mm. The appearance and decrease in size of this
mass is compatible with a benign cyst.
IMPRESSION: Decreased size of benign cyst within the LOWER OUTER LEFT breast. No
further imaging follow-up recommended.

RECOMMENDATION:
Bilateral screening mammogram in 6 months to resume annual mammogram
schedule.

I have discussed the findings and recommendations with the patient.
Results were also provided in writing at the conclusion of the
visit. If applicable, a reminder letter will be sent to the patient
regarding the next appointment.

BI-RADS CATEGORY  2: Benign.
